# Patient Record
Sex: Male | Born: 1990 | Race: White | Hispanic: No | Marital: Single | Smoking: Current every day smoker
Health system: Southern US, Community
[De-identification: ages and names within clinical notes are randomized; demographics above are authoritative.]

## PROBLEM LIST (undated history)

## (undated) DIAGNOSIS — F988 Other specified behavioral and emotional disorders with onset usually occurring in childhood and adolescence: Secondary | ICD-10-CM

---

## 2015-04-14 ENCOUNTER — Emergency Department (HOSPITAL_COMMUNITY)
Admission: EM | Admit: 2015-04-14 | Discharge: 2015-04-15 | Disposition: A | Discharge status: Home / Self Care | Payer: BLUE CROSS/BLUE SHIELD | Attending: Emergency Medicine | Admitting: Emergency Medicine

## 2015-04-14 ENCOUNTER — Encounter (HOSPITAL_COMMUNITY): Payer: Self-pay | Admitting: Emergency Medicine

## 2015-04-14 DIAGNOSIS — Y9389 Activity, other specified: Secondary | ICD-10-CM | POA: Diagnosis not present

## 2015-04-14 DIAGNOSIS — Y999 Unspecified external cause status: Secondary | ICD-10-CM | POA: Diagnosis not present

## 2015-04-14 DIAGNOSIS — S0003XA Contusion of scalp, initial encounter: Secondary | ICD-10-CM

## 2015-04-14 DIAGNOSIS — R55 Syncope and collapse: Secondary | ICD-10-CM | POA: Insufficient documentation

## 2015-04-14 DIAGNOSIS — Z720 Tobacco use: Secondary | ICD-10-CM | POA: Diagnosis not present

## 2015-04-14 DIAGNOSIS — S01511A Laceration without foreign body of lip, initial encounter: Secondary | ICD-10-CM

## 2015-04-14 DIAGNOSIS — Y9289 Other specified places as the place of occurrence of the external cause: Secondary | ICD-10-CM | POA: Insufficient documentation

## 2015-04-14 DIAGNOSIS — Z8659 Personal history of other mental and behavioral disorders: Secondary | ICD-10-CM | POA: Insufficient documentation

## 2015-04-14 DIAGNOSIS — S0990XA Unspecified injury of head, initial encounter: Secondary | ICD-10-CM | POA: Diagnosis present

## 2015-04-14 HISTORY — DX: Other specified behavioral and emotional disorders with onset usually occurring in childhood and adolescence: F98.8

## 2015-04-14 MED ORDER — LIDOCAINE-EPINEPHRINE (PF) 2 %-1:200000 IJ SOLN
10.0000 mL | Freq: Once | INTRAMUSCULAR | Status: AC
  Administered 2015-04-15: 10 mL via INTRADERMAL
  Filled 2015-04-14: qty 20
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
## 2015-04-15 ENCOUNTER — Emergency Department (HOSPITAL_COMMUNITY): Payer: BLUE CROSS/BLUE SHIELD

## 2015-04-15 MED ORDER — IBUPROFEN 800 MG PO TABS: 800.0000 mg | ORAL_TABLET | Freq: Three times a day (TID) | ORAL | Status: AC | PRN

## 2015-04-15 NOTE — ED Notes (Signed)
Pt stable, ambulatory, states understanding of discharge instructions 

## 2015-04-15 NOTE — Discharge Instructions (Signed)
Follow up at [facility] in 3-5 days for sutures removal.  Follow instruction below for further care.    Facial Laceration  A facial laceration is a cut on the face. These injuries can be painful and cause bleeding. Lacerations usually heal quickly, but they need special care to reduce scarring. DIAGNOSIS  Your health care provider will take a medical history, ask for details about how the injury occurred, and examine the wound to determine how deep the cut is. TREATMENT  Some facial lacerations may not require closure. Others may not be able to be closed because of an increased risk of infection. The risk of infection and the chance for successful closure will depend on various factors, including the amount of time since the injury occurred. The wound may be cleaned to help prevent infection. If closure is appropriate, pain medicines may be given if needed. Your health care provider will use stitches (sutures), wound glue (adhesive), or skin adhesive strips to repair the laceration. These tools bring the skin edges together to allow for faster healing and a better cosmetic outcome. If needed, you may also be given a tetanus shot. HOME CARE INSTRUCTIONS  Only take over-the-counter or prescription medicines as directed by your health care provider.  Follow your health care provider's instructions for wound care. These instructions will vary depending on the technique used for closing the wound. For Sutures:  Keep the wound clean and dry.   If you were given a bandage (dressing), you should change it at least once a day. Also change the dressing if it becomes wet or dirty, or as directed by your health care provider.   Wash the wound with soap and water 2 times a day. Rinse the wound off with water to remove all soap. Pat the wound dry with a clean towel.   After cleaning, apply a thin layer of the antibiotic ointment recommended by your health care provider. This will help prevent  infection and keep the dressing from sticking.   You may shower as usual after the first 24 hours. Do not soak the wound in water until the sutures are removed.   Get your sutures removed as directed by your health care provider. With facial lacerations, sutures should usually be taken out after 4-5 days to avoid stitch marks.   Wait a few days after your sutures are removed before applying any makeup. For Skin Adhesive Strips:  Keep the wound clean and dry.   Do not get the skin adhesive strips wet. You may bathe carefully, using caution to keep the wound dry.   If the wound gets wet, pat it dry with a clean towel.   Skin adhesive strips will fall off on their own. You may trim the strips as the wound heals. Do not remove skin adhesive strips that are still stuck to the wound. They will fall off in time.  For Wound Adhesive:  You may briefly wet your wound in the shower or bath. Do not soak or scrub the wound. Do not swim. Avoid periods of heavy sweating until the skin adhesive has fallen off on its own. After showering or bathing, gently pat the wound dry with a clean towel.   Do not apply liquid medicine, cream medicine, ointment medicine, or makeup to your wound while the skin adhesive is in place. This may loosen the film before your wound is healed.   If a dressing is placed over the wound, be careful not to apply tape  directly over the skin adhesive. This may cause the adhesive to be pulled off before the wound is healed.   Avoid prolonged exposure to sunlight or tanning lamps while the skin adhesive is in place.  The skin adhesive will usually remain in place for 5-10 days, then naturally fall off the skin. Do not pick at the adhesive film.  After Healing: Once the wound has healed, cover the wound with sunscreen during the day for 1 full year. This can help minimize scarring. Exposure to ultraviolet light in the first year will darken the scar. It can take 1-2 years  for the scar to lose its redness and to heal completely.  SEEK MEDICAL CARE IF:  You have a fever. SEEK IMMEDIATE MEDICAL CARE IF:  You have redness, pain, or swelling around the wound.   You see ayellowish-white fluid (pus) coming from the wound.    This information is not intended to replace advice given to you by your health care provider. Make sure you discuss any questions you have with your health care provider.   Document Released: 07/10/2004 Document Revised: 06/23/2014 Document Reviewed: 01/13/2013 Elsevier Interactive Patient Education 2016 [employer]   Facial or Scalp Contusion  A facial or scalp contusion is a deep bruise on the face or head. Contusions happen when an injury causes bleeding under the skin. Signs of bruising include pain, puffiness (swelling), and discolored skin. The contusion may turn blue, purple, or yellow. HOME CARE  Only take medicines as told by your doctor.  Put ice on the injured area.  Put ice in a plastic bag.  Place a towel between your skin and the bag.  Leave the ice on for 20 minutes, 2-3 times a day. GET HELP IF:  You have bite problems.  You have pain when chewing.  You are worried about your face not healing normally. GET HELP RIGHT AWAY IF:   You have severe pain or a headache and medicine does not help.  You are very tired or confused, or your personality changes.  You throw up (vomit).  You have a nosebleed that will not stop.  You see two of everything (double vision) or have blurry vision.  You have fluid coming from your nose or ear.  You have problems walking or using your arms or legs. MAKE SURE YOU:   Understand these instructions.  Will watch your condition.  Will get help right away if you are not doing well or get worse.   This information is not intended to replace advice given to you by your health care provider. Make sure you discuss any questions you have with your health care provider.     Document Released: 05/22/2011 Document Revised: 06/23/2014 Document Reviewed: 01/13/2013 Elsevier Interactive Patient Education [employer]
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
## 2017-09-19 ENCOUNTER — Ambulatory Visit (HOSPITAL_COMMUNITY)
Admission: EM | Admit: 2017-09-19 | Discharge: 2017-09-19 | Disposition: A | Discharge status: Home / Self Care | Payer: BLUE CROSS/BLUE SHIELD | Attending: Internal Medicine | Admitting: Internal Medicine

## 2017-09-19 ENCOUNTER — Encounter (HOSPITAL_COMMUNITY): Payer: Self-pay | Admitting: Emergency Medicine

## 2017-09-19 DIAGNOSIS — A084 Viral intestinal infection, unspecified: Secondary | ICD-10-CM | POA: Diagnosis not present

## 2017-09-19 MED ORDER — ONDANSETRON 4 MG PO TBDP: 4.0000 mg | ORAL_TABLET | Freq: Three times a day (TID) | ORAL | 0 refills | Status: AC | PRN

## 2017-09-19 MED ORDER — ONDANSETRON 4 MG PO TBDP
4.0000 mg | ORAL_TABLET | Freq: Once | ORAL | Status: AC
  Administered 2017-09-19: 4 mg via ORAL

## 2017-09-19 MED ORDER — DICYCLOMINE HCL 20 MG PO TABS: 20.0000 mg | ORAL_TABLET | Freq: Two times a day (BID) | ORAL | 0 refills | Status: AC

## 2017-09-19 MED ORDER — ONDANSETRON 4 MG PO TBDP
ORAL_TABLET | ORAL | Status: AC
  Filled 2017-09-19: qty 1

## 2017-09-19 NOTE — ED Triage Notes (Signed)
Pt sts vomiting and chills

## 2017-09-19 NOTE — ED Provider Notes (Signed)
[facility]    CSN: [id number] Arrival date & time: 09/19/17  1633     History   Chief Complaint Chief Complaint  Patient presents with  . Emesis    HPI Timothy Copeland is a 27 y.o. male.   27 year old male, presenting today complaining of epigastric abdominal pain with nausea, vomiting and diarrhea.  Symptoms started approximately 12 hours ago.  Patient states that his girlfriend has been sick with the symptoms for the past 2 days.  States that he has thrown up multiple times.  He said no fever or chills.  The history is provided by the patient.  Emesis  Severity:  Moderate Duration:  1 day Timing:  Intermittent Quality:  Stomach contents Able to tolerate:  Liquids Progression:  Unchanged Chronicity:  New Recent urination:  Normal Context: not post-tussive and not self-induced   Worsened by:  Nothing Ineffective treatments:  None tried Associated symptoms: abdominal pain and diarrhea   Associated symptoms: no arthralgias, no chills, no cough, no fever, no headaches, no myalgias and no sore throat   Abdominal pain:    Location:  Generalized   Quality: aching     Severity:  Mild   Onset quality:  Gradual   Duration:  1 day   Timing:  Constant   Progression:  Unchanged   Chronicity:  New Risk factors: sick contacts   Risk factors: no alcohol use, no diabetes and not pregnant     Past Medical History:  Diagnosis Date  . ADD (attention deficit disorder)     There are no active problems to display for this patient.   History reviewed. No pertinent surgical history.     Home Medications    Prior to Admission medications   Medication Sig Start Date End Date Taking? Authorizing Provider  dicyclomine (BENTYL) 20 MG tablet Take 1 tablet (20 mg total) by mouth 2 (two) times daily. 09/19/17   Blue, Olivia C, PA-C  ibuprofen (ADVIL,MOTRIN) 800 MG tablet Take 1 tablet (800 mg total) by mouth every 8 (eight) hours as needed for moderate pain. 04/15/15    Fayrene Helperran, [place], PA-C  ondansetron (ZOFRAN-ODT) 4 MG disintegrating tablet Take 1 tablet (4 mg total) by mouth every 8 (eight) hours as needed for nausea or vomiting. 09/19/17   Blue, Marylene Landlivia C, PA-C    Family History History reviewed. No pertinent family history.  Social History Social History   Tobacco Use  . Smoking status: Current Every Day Smoker  Substance Use Topics  . Alcohol use: Yes  . Drug use: No     Allergies   Patient has no known allergies.   Review of Systems Review of Systems  Constitutional: Negative for chills and fever.  HENT: Negative for ear pain and sore throat.   Eyes: Negative for pain and visual disturbance.  Respiratory: Negative for cough and shortness of breath.   Cardiovascular: Negative for chest pain and palpitations.  Gastrointestinal: Positive for abdominal pain, diarrhea, nausea and vomiting.  Genitourinary: Negative for dysuria and hematuria.  Musculoskeletal: Negative for arthralgias, back pain and myalgias.  Skin: Negative for color change and rash.  Neurological: Negative for seizures, syncope and headaches.  All other systems reviewed and are negative.    Physical Exam Triage Vital Signs ED Triage Vitals [09/19/17 1747]  Enc Vitals Group     BP 111/69     Pulse Rate (!) 110     Resp 18     Temp 97.8 F (36.6 C)  Temp Source Oral     SpO2 100 %     Weight      Height      Head Circumference      Peak Flow      Pain Score      Pain Loc      Pain Edu?      Excl. in GC?    No data found.  Updated Vital Signs BP 111/69 (BP Location: Right Arm)   Pulse (!) 110   Temp 97.8 F (36.6 C) (Oral)   Resp 18   SpO2 100%   Visual Acuity Right Eye Distance:   Left Eye Distance:   Bilateral Distance:    Right Eye Near:   Left Eye Near:    Bilateral Near:     Physical Exam  Constitutional: He appears well-developed and well-nourished.  HENT:  Head: Normocephalic and atraumatic.  Right Ear: Hearing, tympanic membrane,  external ear and ear canal normal.  Left Ear: Hearing, tympanic membrane, external ear and ear canal normal.  Nose: Nose normal.  Mouth/Throat: Uvula is midline and oropharynx is clear and moist. No oropharyngeal exudate, posterior oropharyngeal edema, posterior oropharyngeal erythema or tonsillar abscesses.  Eyes: Conjunctivae are normal.  Neck: Neck supple.  Cardiovascular: Normal rate and regular rhythm.  No murmur heard. Pulmonary/Chest: Effort normal and breath sounds normal. No stridor. No respiratory distress. He has no decreased breath sounds. He has no wheezes. He has no rhonchi. He has no rales.  Abdominal: Soft. There is tenderness in the epigastric area.  Musculoskeletal: He exhibits no edema.  Neurological: He is alert.  Skin: Skin is warm and dry.  Psychiatric: He has a normal mood and affect.  Nursing note and vitals reviewed.    UC Treatments / Results  Labs (all labs ordered are listed, but only abnormal results are displayed) Labs Reviewed - No data to display  EKG None Radiology No results found.  Procedures Procedures (including critical care time)  Medications Ordered in UC Medications  ondansetron (ZOFRAN-ODT) disintegrating tablet 4 mg (4 mg Oral Given 09/19/17 1834)     Initial Impression / Assessment and Plan / UC Course  I have reviewed the triage vital signs and the nursing notes.  Pertinent labs & imaging results that were available during my care of the patient were reviewed by me and considered in my medical decision making (see chart for details).     Patient feeling better after ODT Zofran.  Will discharge home with Zofran and Bentyl  Final Clinical Impressions(s) / UC Diagnoses   Final diagnoses:  Viral gastroenteritis    ED Discharge Orders        Ordered    dicyclomine (BENTYL) 20 MG tablet  2 times daily     09/19/17 1820    ondansetron (ZOFRAN-ODT) 4 MG disintegrating tablet  Every 8 hours PRN     09/19/17 1820        Controlled Substance Prescriptions [place] Controlled Substance Registry consulted? Not Applicable   Alecia Lemming, [place] 09/19/17 1845
